# Patient Record
Sex: Female | Born: 1975
Health system: Southern US, Community
[De-identification: ages and names within clinical notes are randomized; demographics above are authoritative.]

## PROBLEM LIST (undated history)

## (undated) DIAGNOSIS — F419 Anxiety disorder, unspecified: Secondary | ICD-10-CM

## (undated) DIAGNOSIS — F1910 Other psychoactive substance abuse, uncomplicated: Secondary | ICD-10-CM

## (undated) DIAGNOSIS — F32A Depression, unspecified: Secondary | ICD-10-CM

## (undated) DIAGNOSIS — T7840XA Allergy, unspecified, initial encounter: Secondary | ICD-10-CM

## (undated) DIAGNOSIS — E785 Hyperlipidemia, unspecified: Secondary | ICD-10-CM

## (undated) HISTORY — DX: Hyperlipidemia, unspecified: E78.5

## (undated) HISTORY — DX: Allergy, unspecified, initial encounter: T78.40XA

## (undated) HISTORY — DX: Other psychoactive substance abuse, uncomplicated: F19.10

## (undated) HISTORY — DX: Depression, unspecified: F32.A

## (undated) HISTORY — DX: Anxiety disorder, unspecified: F41.9

---
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
## 1999-01-23 ENCOUNTER — Other Ambulatory Visit: Admission: RE | Admit: 1999-01-23 | Discharge: 1999-01-23 | Payer: Self-pay | Admitting: Obstetrics and Gynecology

## 1999-07-16 ENCOUNTER — Other Ambulatory Visit: Admission: RE | Admit: 1999-07-16 | Discharge: 1999-07-16 | Payer: Self-pay | Admitting: Obstetrics and Gynecology

## 2000-10-16 ENCOUNTER — Other Ambulatory Visit: Admission: RE | Admit: 2000-10-16 | Discharge: 2000-10-16 | Payer: Self-pay | Admitting: Obstetrics and Gynecology

## 2000-11-23 ENCOUNTER — Ambulatory Visit (HOSPITAL_BASED_OUTPATIENT_CLINIC_OR_DEPARTMENT_OTHER): Admission: RE | Admit: 2000-11-23 | Discharge: 2000-11-23 | Payer: Self-pay | Admitting: Surgery

## 2005-08-26 ENCOUNTER — Emergency Department (HOSPITAL_COMMUNITY): Admission: EM | Admit: 2005-08-26 | Discharge: 2005-08-26 | Payer: Self-pay | Admitting: Emergency Medicine

## 2015-09-29 ENCOUNTER — Other Ambulatory Visit: Payer: Self-pay | Admitting: Advanced Practice Midwife

## 2015-10-02 DIAGNOSIS — Z3041 Encounter for surveillance of contraceptive pills: Secondary | ICD-10-CM | POA: Diagnosis not present

## 2015-11-01 DIAGNOSIS — F411 Generalized anxiety disorder: Secondary | ICD-10-CM | POA: Diagnosis not present

## 2015-11-01 DIAGNOSIS — F331 Major depressive disorder, recurrent, moderate: Secondary | ICD-10-CM | POA: Diagnosis not present

## 2016-01-24 DIAGNOSIS — F331 Major depressive disorder, recurrent, moderate: Secondary | ICD-10-CM | POA: Diagnosis not present

## 2016-01-24 DIAGNOSIS — F411 Generalized anxiety disorder: Secondary | ICD-10-CM | POA: Diagnosis not present

## 2016-04-17 DIAGNOSIS — F331 Major depressive disorder, recurrent, moderate: Secondary | ICD-10-CM | POA: Diagnosis not present

## 2016-04-17 DIAGNOSIS — F411 Generalized anxiety disorder: Secondary | ICD-10-CM | POA: Diagnosis not present

## 2016-06-17 DIAGNOSIS — H5213 Myopia, bilateral: Secondary | ICD-10-CM | POA: Diagnosis not present

## 2016-06-17 DIAGNOSIS — H52203 Unspecified astigmatism, bilateral: Secondary | ICD-10-CM | POA: Diagnosis not present

## 2016-07-16 DIAGNOSIS — F331 Major depressive disorder, recurrent, moderate: Secondary | ICD-10-CM | POA: Diagnosis not present

## 2016-07-16 DIAGNOSIS — F411 Generalized anxiety disorder: Secondary | ICD-10-CM | POA: Diagnosis not present

## 2016-10-08 DIAGNOSIS — F331 Major depressive disorder, recurrent, moderate: Secondary | ICD-10-CM | POA: Diagnosis not present

## 2016-10-08 DIAGNOSIS — F411 Generalized anxiety disorder: Secondary | ICD-10-CM | POA: Diagnosis not present

## 2016-10-15 DIAGNOSIS — Z131 Encounter for screening for diabetes mellitus: Secondary | ICD-10-CM | POA: Diagnosis not present

## 2016-10-15 DIAGNOSIS — Z1329 Encounter for screening for other suspected endocrine disorder: Secondary | ICD-10-CM | POA: Diagnosis not present

## 2016-10-15 DIAGNOSIS — Z1151 Encounter for screening for human papillomavirus (HPV): Secondary | ICD-10-CM | POA: Diagnosis not present

## 2016-10-15 DIAGNOSIS — Z6834 Body mass index (BMI) 34.0-34.9, adult: Secondary | ICD-10-CM | POA: Diagnosis not present

## 2016-10-15 DIAGNOSIS — Z13220 Encounter for screening for lipoid disorders: Secondary | ICD-10-CM | POA: Diagnosis not present

## 2016-10-15 DIAGNOSIS — Z0000 Encounter for general adult medical examination without abnormal findings: Secondary | ICD-10-CM | POA: Diagnosis not present

## 2016-10-15 DIAGNOSIS — Z1231 Encounter for screening mammogram for malignant neoplasm of breast: Secondary | ICD-10-CM | POA: Diagnosis not present

## 2016-10-15 DIAGNOSIS — Z01419 Encounter for gynecological examination (general) (routine) without abnormal findings: Secondary | ICD-10-CM | POA: Diagnosis not present

## 2016-10-15 DIAGNOSIS — Z130 Encounter for screening for diseases of the blood and blood-forming organs and certain disorders involving the immune mechanism: Secondary | ICD-10-CM | POA: Diagnosis not present

## 2016-10-23 DIAGNOSIS — N6322 Unspecified lump in the left breast, upper inner quadrant: Secondary | ICD-10-CM | POA: Diagnosis not present

## 2016-10-23 DIAGNOSIS — N6002 Solitary cyst of left breast: Secondary | ICD-10-CM | POA: Diagnosis not present

## 2017-01-01 DIAGNOSIS — F411 Generalized anxiety disorder: Secondary | ICD-10-CM | POA: Diagnosis not present

## 2017-01-01 DIAGNOSIS — F331 Major depressive disorder, recurrent, moderate: Secondary | ICD-10-CM | POA: Diagnosis not present

## 2017-05-07 DIAGNOSIS — F411 Generalized anxiety disorder: Secondary | ICD-10-CM | POA: Diagnosis not present

## 2017-05-07 DIAGNOSIS — F331 Major depressive disorder, recurrent, moderate: Secondary | ICD-10-CM | POA: Diagnosis not present

## 2017-07-30 DIAGNOSIS — F411 Generalized anxiety disorder: Secondary | ICD-10-CM | POA: Diagnosis not present

## 2017-07-30 DIAGNOSIS — F331 Major depressive disorder, recurrent, moderate: Secondary | ICD-10-CM | POA: Diagnosis not present

## 2017-10-28 DIAGNOSIS — F411 Generalized anxiety disorder: Secondary | ICD-10-CM | POA: Diagnosis not present

## 2017-10-28 DIAGNOSIS — F331 Major depressive disorder, recurrent, moderate: Secondary | ICD-10-CM | POA: Diagnosis not present

## 2017-11-03 DIAGNOSIS — R350 Frequency of micturition: Secondary | ICD-10-CM | POA: Diagnosis not present

## 2017-11-03 DIAGNOSIS — Z01419 Encounter for gynecological examination (general) (routine) without abnormal findings: Secondary | ICD-10-CM | POA: Diagnosis not present

## 2017-11-03 DIAGNOSIS — Z1231 Encounter for screening mammogram for malignant neoplasm of breast: Secondary | ICD-10-CM | POA: Diagnosis not present

## 2017-11-03 DIAGNOSIS — N3941 Urge incontinence: Secondary | ICD-10-CM | POA: Diagnosis not present

## 2017-11-03 DIAGNOSIS — Z6836 Body mass index (BMI) 36.0-36.9, adult: Secondary | ICD-10-CM | POA: Diagnosis not present

## 2018-02-03 DIAGNOSIS — F411 Generalized anxiety disorder: Secondary | ICD-10-CM | POA: Diagnosis not present

## 2018-02-03 DIAGNOSIS — F331 Major depressive disorder, recurrent, moderate: Secondary | ICD-10-CM | POA: Diagnosis not present

## 2018-06-08 DIAGNOSIS — F331 Major depressive disorder, recurrent, moderate: Secondary | ICD-10-CM | POA: Diagnosis not present

## 2018-06-08 DIAGNOSIS — F411 Generalized anxiety disorder: Secondary | ICD-10-CM | POA: Diagnosis not present

## 2018-10-05 DIAGNOSIS — F411 Generalized anxiety disorder: Secondary | ICD-10-CM | POA: Diagnosis not present

## 2018-10-05 DIAGNOSIS — F331 Major depressive disorder, recurrent, moderate: Secondary | ICD-10-CM | POA: Diagnosis not present

## 2019-03-10 DIAGNOSIS — Z01419 Encounter for gynecological examination (general) (routine) without abnormal findings: Secondary | ICD-10-CM | POA: Diagnosis not present

## 2019-03-10 DIAGNOSIS — Z1151 Encounter for screening for human papillomavirus (HPV): Secondary | ICD-10-CM | POA: Diagnosis not present

## 2019-03-10 DIAGNOSIS — Z1231 Encounter for screening mammogram for malignant neoplasm of breast: Secondary | ICD-10-CM | POA: Diagnosis not present

## 2019-03-10 DIAGNOSIS — Z6835 Body mass index (BMI) 35.0-35.9, adult: Secondary | ICD-10-CM | POA: Diagnosis not present

## 2020-07-23 ENCOUNTER — Other Ambulatory Visit: Payer: Self-pay | Admitting: Family Medicine

## 2020-07-23 ENCOUNTER — Ambulatory Visit: Payer: BC Managed Care – PPO | Admitting: Family Medicine

## 2020-07-23 ENCOUNTER — Other Ambulatory Visit: Payer: Self-pay

## 2020-07-23 ENCOUNTER — Encounter: Payer: Self-pay | Admitting: Family Medicine

## 2020-07-23 ENCOUNTER — Ambulatory Visit (INDEPENDENT_AMBULATORY_CARE_PROVIDER_SITE_OTHER): Payer: BC Managed Care – PPO

## 2020-07-23 VITALS — BP 97/63 | HR 75 | Temp 98.3°F | Ht 68.0 in | Wt 239.2 lb

## 2020-07-23 DIAGNOSIS — M545 Low back pain, unspecified: Secondary | ICD-10-CM | POA: Diagnosis not present

## 2020-07-23 DIAGNOSIS — R202 Paresthesia of skin: Secondary | ICD-10-CM

## 2020-07-23 DIAGNOSIS — Z7689 Persons encountering health services in other specified circumstances: Secondary | ICD-10-CM

## 2020-07-23 LAB — CMP14+EGFR
Albumin: 4.4 g/dL (ref 3.8–4.8)
CO2: 23 mmol/L (ref 20–29)
Creatinine, Ser: 0.77 mg/dL (ref 0.57–1.00)
GFR calc non Af Amer: 95 mL/min/{1.73_m2} (ref >59)
Potassium: 4.6 mmol/L (ref 3.5–5.2)
Sodium: 139 mmol/L (ref 134–144)
Total Protein: 7 g/dL (ref 6.0–8.5)

## 2020-07-23 LAB — CBC WITH DIFFERENTIAL/PLATELET
Basophils Absolute: 0.1 10*3/uL (ref 0.0–0.2)
MCH: 29.2 pg (ref 26.6–33.0)
Monocytes Absolute: 0.7 10*3/uL (ref 0.1–0.9)
WBC: 9.6 10*3/uL (ref 3.4–10.8)

## 2020-07-23 MED ORDER — PREDNISONE 10 MG (21) PO TBPK: ORAL_TABLET | ORAL | 0 refills | Status: DC

## 2020-07-23 NOTE — Patient Instructions (Signed)
Orthopedic physical assessment (7th ed., pp. 164- 242). Elsevier.">  Paresthesia Paresthesia is an abnormal burning or prickling sensation. It is usually felt in the hands, arms, legs, or feet. However, it may occur in any part of the body. Usually, paresthesia is not painful. It may feel like:  Tingling or numbness.  Buzzing.  Itching. Paresthesia may occur without any clear cause, or it may be caused by:  Breathing too quickly (hyperventilation).  Pressure on a nerve.  An underlying medical condition.  Side effects of a medication.  Nutritional deficiencies.  Exposure to toxic chemicals. Most people experience temporary (transient) paresthesia at some time in their lives. For some people, it may be long-lasting (chronic) because of an underlying medical condition. If you have paresthesia that lasts a long time, you need to be evaluated by your health care provider. Follow these instructions at home: Alcohol use  Do not drink alcohol if: ? Your health care provider tells you not to drink. ? You are pregnant, may be pregnant, or are planning to become pregnant.  If you drink alcohol: ? Limit how much you use to:  0-1 drink a day for women.  0-2 drinks a day for men. ? Be aware of how much alcohol is in your drink. In the [place] one drink equals one 12 oz bottle of beer (355 mL), one 5 oz glass of wine (148 mL), or one 1 oz glass of hard liquor (44 mL).   Nutrition  Eat a healthy diet. This includes: ? Eating foods that are high in fiber, such as fresh fruits and vegetables, whole grains, and beans. ? Limiting foods that are high in fat and processed sugars, such as fried or sweet foods.   General instructions  Take over-the-counter and prescription medicines only as told by your health care provider.  Do not use any products that contain nicotine or tobacco, such as cigarettes and e-cigarettes. These can keep blood from reaching damaged nerves. If you need help quitting,  ask your health care provider.  If you have diabetes, work closely with your health care provider to keep your blood sugar under control.  If you have numbness in your feet: ? Check every day for signs of injury or infection. Watch for redness, warmth, and swelling. ? Wear padded socks and comfortable shoes. These help protect your feet.  Keep all follow-up visits as told by your health care provider. This is important. Contact a health care provider if you:  Have paresthesia that gets worse or does not go away.  Have numbness after an injury.  Have a burning or prickling feeling that gets worse when you walk.  Have pain, cramps, or dizziness, or you faint.  Develop a rash. Get help right away if you:  Feel muscle weakness.  Develop new weakness in an arm or leg.  Have trouble walking or moving.  Have problems with speech, understanding, or vision.  Feel confused.  Cannot control your bladder or bowel movements. Summary  Paresthesia is an abnormal burning or prickling sensation that is usually felt in the hands, arms, legs, or feet. It may also occur in other parts of the body.  Paresthesia may occur without any clear cause, or it may be caused by breathing too quickly (hyperventilation), pressure on a nerve, an underlying medical condition, side effects of a medication, nutritional deficiencies, or exposure to toxic chemicals.  If you have paresthesia that lasts a long time, you need to be evaluated by your health care provider.   This information is not intended to replace advice given to you by your health care provider. Make sure you discuss any questions you have with your health care provider. Document Revised: 02/28/2020 Document Reviewed: 02/28/2020 Elsevier Patient Education  2021 Elsevier Inc.  

## 2020-07-23 NOTE — Progress Notes (Signed)
Established Patient Office Visit  Subjective:  Patient ID: Candice Alvarez, female    DOB: 21-Oct-1975  Age: 45 y.o. MRN: [id number]  CC:  Chief Complaint  Patient presents with  . New Patient (Initial Visit)    HPI Candice Alvarez presents to establish care. She denies medical problems. She sees Cyril for paps/mammograms and physicals. She is UTD and had her last physical with lab work on 05/17/20 . She has one concern today.  1. Right leg numbness Briann reports right leg numbness for 1 week. She also reports a burning sensation in this leg. She denies injury or trauma. She denies changes in gait, weakness, swelling, rash, saddle anesthesia, or changes in bowel or bladder control. She has taken advil a few times without improvement. She does reports some mild lower back pain that occurs when she bends or moves in certain ways. She also reports some discomfort in the right hip.   Past Medical History:  Diagnosis Date  . Allergy   . Anxiety   . Depression   . Hyperlipidemia   . Substance abuse ([place])    hx of heroin addiction-last used in 2007    Past Surgical History:  Procedure Laterality Date  . WISDOM TOOTH EXTRACTION  1994    Family History  Problem Relation Age of Onset  . Arthritis Mother   . Hyperlipidemia Mother   . Hypertension Mother   . Stroke Mother   . Cancer Father        prostate  . Hyperlipidemia Father   . Hyperlipidemia Maternal Grandmother   . Alcohol abuse Brother   . Anxiety disorder Brother   . Depression Brother   . Drug abuse Brother     Social History   Socioeconomic History  . Marital status: Married    Spouse name: Candice Alvarez Resides  . Number of children: 0  . Years of education: 77  . Highest education level: Some college, no degree  Occupational History  . Occupation: Environmental education officer at [employer]  . Smoking status: Former Smoker    Packs/day: 0.25    Years: 14.00    Pack years: 3.50    Types:  Cigarettes    Quit date: 2007    Years since quitting: 15.1  . Smokeless tobacco: Never Used  Vaping Use  . Vaping Use: Never used  Substance and Sexual Activity  . Alcohol use: Yes    Alcohol/week: 1.0 standard drink    Types: 1 Shots of liquor per week  . Drug use: Yes    Types: Marijuana  . Sexual activity: Yes    Birth control/protection: Surgical    Comment: husband has a vasectomy  Other Topics Concern  . Not on file  Social History Narrative  . Not on file   Social Determinants of Health   Financial Resource Strain: Not on file  Food Insecurity: Not on file  Transportation Needs: Not on file  Physical Activity: Not on file  Stress: Not on file  Social Connections: Not on file  Intimate Partner Violence: Not on file    Outpatient Medications Prior to Visit  Medication Sig Dispense Refill  . acyclovir (ZOVIRAX) 400 MG tablet Take 400 mg by mouth every 8 (eight) hours.    Marland Kitchen FLUoxetine (PROZAC) 40 MG capsule Take 40 mg by mouth daily.     No facility-administered medications prior to visit.    Allergies  Allergen Reactions  . Lamictal [Lamotrigine] Other (See Comments)  [name] Syndrome  . Sulfa Antibiotics Itching    ROS Review of Systems Negative unless specially indicated above in HPI.   Objective:    Physical Exam Vitals and nursing note reviewed.  Constitutional:      General: She is not in acute distress.    Appearance: Normal appearance. She is not ill-appearing, toxic-appearing or diaphoretic.  HENT:     Head: Normocephalic and atraumatic.  Eyes:     Extraocular Movements: Extraocular movements intact.     Pupils: Pupils are equal, round, and reactive to light.  Cardiovascular:     Rate and Rhythm: Normal rate and regular rhythm.     Heart sounds: Normal heart sounds. No murmur heard.   Pulmonary:     Effort: Pulmonary effort is normal. No respiratory distress.     Breath sounds: Normal breath sounds.  Musculoskeletal:         General: No swelling, tenderness or deformity. Normal range of motion.     Cervical back: Normal.     Thoracic back: Normal.     Lumbar back: Normal. Negative right straight leg raise test and negative left straight leg raise test.     Right hip: Normal.     Left hip: Normal.     Right upper leg: Normal.     Left upper leg: Normal.     Right lower leg: No swelling, tenderness or bony tenderness. No edema.     Left lower leg: No swelling, tenderness or bony tenderness. No edema.  Skin:    General: Skin is warm and dry.  Neurological:     Mental Status: She is alert and oriented to person, place, and time.     Cranial Nerves: No cranial nerve deficit or facial asymmetry.     Motor: No weakness, tremor, atrophy or abnormal muscle tone.     Coordination: Coordination is intact.     Gait: Gait normal.     Comments: Patient reports slight decreased sensation on exam to right leg.   Psychiatric:        Mood and Affect: Mood normal.        Behavior: Behavior normal.     BP 97/63   Pulse 75   Temp 98.3 F (36.8 C) (Temporal)   Ht 5' 8"  (1.727 m)   Wt 239 lb 4 oz (108.5 kg)   BMI 36.38 kg/m  Wt Readings from Last 3 Encounters:  07/23/20 239 lb 4 oz (108.5 kg)     Health Maintenance Due  Topic Date Due  . Hepatitis C Screening  Never done  . HIV Screening  Never done  . PAP SMEAR-Modifier  Never done    There are no preventive care reminders to display for this patient.  No results found for: TSH No results found for: WBC, HGB, HCT, MCV, PLT No results found for: NA, K, CHLORIDE, CO2, GLUCOSE, BUN, CREATININE, BILITOT, ALKPHOS, AST, ALT, PROT, ALBUMIN, CALCIUM, ANIONGAP, EGFR, GFR No results found for: CHOL No results found for: HDL No results found for: LDLCALC No results found for: TRIG No results found for: CHOLHDL No results found for: HGBA1C    Assessment & Plan:   Alaysiah was seen today for new patient (initial visit).  Diagnoses and all orders for this  visit:  Paresthesia of right leg ? Sciatica. Lumbar xray today in office, radiology report pending. Labs pending as below. Prednisone dose pack given. Take advil OTC. Will notify patient of result.  Discussed referral to neurology if  no improvement or worsening in symptoms. Return to office for new or worsening symptoms, or if symptoms persist.  -     predniSONE (STERAPRED UNI-PAK 21 TAB) 10 MG (21) TBPK tablet; Use as directed on back of pill pack -     DG Lumbar Spine; Future -     CMP14+EGFR -     CBC with Differential/Platelet -     Vitamin B12  Acute bilateral low back pain, unspecified whether sciatica present Steroid dose pack given. Advil, rest, heat, ice as needed. Return to office for new or worsening symptoms, or if symptoms persist.  -     predniSONE (STERAPRED UNI-PAK 21 TAB) 10 MG (21) TBPK tablet; Use as directed on back of pill pack -     DG Lumbar Spine; Future  Encounter to establish care -     CMP14+EGFR -     CBC with Differential/Platelet    Follow-up: Return in about 6 months (around 01/20/2021) for CPE, or sooner if symptoms worsen or fail to improve.  The patient indicates understanding of these issues and agrees with the plan.  Gwenlyn Perking, FNP

## 2020-07-24 LAB — CMP14+EGFR
ALT: 11 IU/L (ref 0–32)
AST: 11 IU/L (ref 0–40)
Albumin/Globulin Ratio: 1.7 (ref 1.2–2.2)
Alkaline Phosphatase: 64 IU/L (ref 44–121)
BUN/Creatinine Ratio: 13 (ref 9–23)
BUN: 10 mg/dL (ref 6–24)
Calcium: 9.4 mg/dL (ref 8.7–10.2)
Chloride: 102 mmol/L (ref 96–106)
GFR calc Af Amer: 109 mL/min/{1.73_m2} (ref >59)
Globulin, Total: 2.6 g/dL (ref 1.5–4.5)
Glucose: 88 mg/dL (ref 65–99)

## 2020-07-24 LAB — CBC WITH DIFFERENTIAL/PLATELET
Basos: 1 %
EOS (ABSOLUTE): 0.1 10*3/uL (ref 0.0–0.4)
Eos: 2 %
Hematocrit: 43.5 % (ref 34.0–46.6)
Hemoglobin: 14.3 g/dL (ref 11.1–15.9)
Immature Grans (Abs): 0.1 10*3/uL (ref 0.0–0.1)
Immature Granulocytes: 1 %
Lymphocytes Absolute: 3 10*3/uL (ref 0.7–3.1)
Lymphs: 31 %
MCHC: 32.9 g/dL (ref 31.5–35.7)
MCV: 89 fL (ref 79–97)
Monocytes: 7 %
Neutrophils Absolute: 5.7 10*3/uL (ref 1.4–7.0)
Neutrophils: 58 %
Platelets: 265 10*3/uL (ref 150–450)
RBC: 4.9 x10E6/uL (ref 3.77–5.28)
RDW: 12.5 % (ref 11.7–15.4)

## 2020-07-24 LAB — VITAMIN B12: Vitamin B-12: 353 pg/mL (ref 232–1245)

## 2020-07-26 ENCOUNTER — Ambulatory Visit: Payer: Self-pay | Admitting: Family Medicine

## 2020-07-30 ENCOUNTER — Telehealth: Payer: Self-pay

## 2020-07-30 NOTE — Telephone Encounter (Signed)
Patient was seen 07/23/20 - given prednisone

## 2020-07-31 NOTE — Telephone Encounter (Signed)
Did that improve her symptoms? She can continue antiinflammatories. Could do PT? Or referral to ortho?

## 2020-07-31 NOTE — Telephone Encounter (Signed)
Left message to call back  

## 2021-01-21 ENCOUNTER — Encounter: Payer: Self-pay | Admitting: Family Medicine

## 2021-01-21 ENCOUNTER — Ambulatory Visit: Payer: BC Managed Care – PPO | Admitting: Family Medicine

## 2021-01-21 ENCOUNTER — Other Ambulatory Visit: Payer: Self-pay

## 2021-01-21 VITALS — BP 100/60 | HR 81 | Temp 98.3°F | Ht 68.0 in | Wt 231.5 lb

## 2021-01-21 DIAGNOSIS — E785 Hyperlipidemia, unspecified: Secondary | ICD-10-CM

## 2021-01-21 DIAGNOSIS — F1911 Other psychoactive substance abuse, in remission: Secondary | ICD-10-CM

## 2021-01-21 DIAGNOSIS — Z0001 Encounter for general adult medical examination with abnormal findings: Secondary | ICD-10-CM | POA: Diagnosis not present

## 2021-01-21 DIAGNOSIS — E669 Obesity, unspecified: Secondary | ICD-10-CM | POA: Diagnosis not present

## 2021-01-21 DIAGNOSIS — Z0000 Encounter for general adult medical examination without abnormal findings: Secondary | ICD-10-CM

## 2021-01-21 NOTE — Patient Instructions (Signed)
Health Maintenance, Female Adopting a healthy lifestyle and getting preventive care are important in promoting health and wellness. Ask your health care provider about: The right schedule for you to have regular tests and exams. Things you can do on your own to prevent diseases and keep yourself healthy. What should I know about diet, weight, and exercise? Eat a healthy diet  Eat a diet that includes plenty of vegetables, fruits, low-fat dairy products, and lean protein. Do not eat a lot of foods that are high in solid fats, added sugars, or sodium.  Maintain a healthy weight Body mass index (BMI) is used to identify weight problems. It estimates body fat based on height and weight. Your health care provider can help determineyour BMI and help you achieve or maintain a healthy weight. Get regular exercise Get regular exercise. This is one of the most important things you can do for your health. Most adults should: Exercise for at least 150 minutes each week. The exercise should increase your heart rate and make you sweat (moderate-intensity exercise). Do strengthening exercises at least twice a week. This is in addition to the moderate-intensity exercise. Spend less time sitting. Even light physical activity can be beneficial. Watch cholesterol and blood lipids Have your blood tested for lipids and cholesterol at 44 years of age, then havethis test every 5 years. Have your cholesterol levels checked more often if: Your lipid or cholesterol levels are high. You are older than 44 years of age. You are at high risk for heart disease. What should I know about cancer screening? Depending on your health history and family history, you may need to have cancer screening at various ages. This may include screening for: Breast cancer. Cervical cancer. Colorectal cancer. Skin cancer. Lung cancer. What should I know about heart disease, diabetes, and high blood pressure? Blood pressure and heart  disease High blood pressure causes heart disease and increases the risk of stroke. This is more likely to develop in people who have high blood pressure readings, are of African descent, or are overweight. Have your blood pressure checked: Every 3-5 years if you are 18-39 years of age. Every year if you are 40 years old or older. Diabetes Have regular diabetes screenings. This checks your fasting blood sugar level. Have the screening done: Once every three years after age 40 if you are at a normal weight and have a low risk for diabetes. More often and at a younger age if you are overweight or have a high risk for diabetes. What should I know about preventing infection? Hepatitis B If you have a higher risk for hepatitis B, you should be screened for this virus. Talk with your health care provider to find out if you are at risk forhepatitis B infection. Hepatitis C Testing is recommended for: Everyone born from 1945 through 1965. Anyone with known risk factors for hepatitis C. Sexually transmitted infections (STIs) Get screened for STIs, including gonorrhea and chlamydia, if: You are sexually active and are younger than 44 years of age. You are older than 44 years of age and your health care provider tells you that you are at risk for this type of infection. Your sexual activity has changed since you were last screened, and you are at increased risk for chlamydia or gonorrhea. Ask your health care provider if you are at risk. Ask your health care provider about whether you are at high risk for HIV. Your health care provider may recommend a prescription medicine to help   prevent HIV infection. If you choose to take medicine to prevent HIV, you should first get tested for HIV. You should then be tested every 3 months for as long as you are taking the medicine. Pregnancy If you are about to stop having your period (premenopausal) and you may become pregnant, seek counseling before you get  pregnant. Take 400 to 800 micrograms (mcg) of folic acid every day if you become pregnant. Ask for birth control (contraception) if you want to prevent pregnancy. Osteoporosis and menopause Osteoporosis is a disease in which the bones lose minerals and strength with aging. This can result in bone fractures. If you are 65 years old or older, or if you are at risk for osteoporosis and fractures, ask your health care provider if you should: Be screened for bone loss. Take a calcium or vitamin D supplement to lower your risk of fractures. Be given hormone replacement therapy (HRT) to treat symptoms of menopause. Follow these instructions at home: Lifestyle Do not use any products that contain nicotine or tobacco, such as cigarettes, e-cigarettes, and chewing tobacco. If you need help quitting, ask your health care provider. Do not use street drugs. Do not share needles. Ask your health care provider for help if you need support or information about quitting drugs. Alcohol use Do not drink alcohol if: Your health care provider tells you not to drink. You are pregnant, may be pregnant, or are planning to become pregnant. If you drink alcohol: Limit how much you use to 0-1 drink a day. Limit intake if you are breastfeeding. Be aware of how much alcohol is in your drink. In the [place] one drink equals one 12 oz bottle of beer (355 mL), one 5 oz glass of wine (148 mL), or one 1 oz glass of hard liquor (44 mL). General instructions Schedule regular health, dental, and eye exams. Stay current with your vaccines. Tell your health care provider if: You often feel depressed. You have ever been abused or do not feel safe at home. Summary Adopting a healthy lifestyle and getting preventive care are important in promoting health and wellness. Follow your health care provider's instructions about healthy diet, exercising, and getting tested or screened for diseases. Follow your health care provider's  instructions on monitoring your cholesterol and blood pressure. This information is not intended to replace advice given to you by your health care provider. Make sure you discuss any questions you have with your healthcare provider. Document Revised: 05/12/2018 Document Reviewed: 05/12/2018 Elsevier Patient Education  2022 Elsevier Inc.  

## 2021-01-21 NOTE — Progress Notes (Signed)
Candice Alvarez is a 45 y.o. female presents to office today for annual physical exam examination.    Concerns today include: 1. None. She has a physical with her Ob/gyn in December of 2021. She had a pap, mammo, and labs completed at that time. She was told that her cholesterol was high. She knows she needs to work on her diet and exercise to lower her cholesterol. She will fill out a records request that have these records sent over.   Occupation: manages an Psychologist, counselling, Marital status: married, Substance use: marijuan, hx of heroin abuse- last use in 2007 Diet: regular, Exercise: walks some Last eye exam: in last year Last dental exam: within 6 months Last mammogram: this year Last pap smear: this year  Depression screen [facility] 2/9 01/21/2021  Decreased Interest 0  Down, Depressed, Hopeless 0  PHQ - 2 Score 0     Past Medical History:  Diagnosis Date   Allergy    Anxiety    Depression    Hyperlipidemia    Substance abuse ([facility]CC)    hx of heroin addiction-last used in 2007   Social History   Socioeconomic History   Marital status: Married    Spouse name: Candice Alvarez   Number of children: 0   Years of education: 14   Highest education level: Some college, no degree  Occupational History   Occupation: Engineer, manufacturing at [employer]  Tobacco Use   Smoking status: Former    Packs/day: 0.25    Years: 14.00    Pack years: 3.50    Types: Cigarettes    Quit date: 2007    Years since quitting: 15.6   Smokeless tobacco: Never  Vaping Use   Vaping Use: Never used  Substance and Sexual Activity   Alcohol use: Yes    Alcohol/week: 1.0 standard drink    Types: 1 Shots of liquor per week   Drug use: Yes    Types: Marijuana   Sexual activity: Yes    Birth control/protection: Surgical    Comment: husband has a vasectomy  Other Topics Concern   Not on file  Social History Narrative   Not on file   Social Determinants of Health   Financial Resource Strain: Not  on file  Food Insecurity: Not on file  Transportation Needs: Not on file  Physical Activity: Not on file  Stress: Not on file  Social Connections: Not on file  Intimate Partner Violence: Not on file   Past Surgical History:  Procedure Laterality Date   WISDOM TOOTH EXTRACTION  1994   Family History  Problem Relation Age of Onset   Arthritis Mother    Hyperlipidemia Mother    Hypertension Mother    Stroke Mother    Cancer Father        prostate   Hyperlipidemia Father    Hyperlipidemia Maternal Grandmother    Alcohol abuse Brother    Anxiety disorder Brother    Depression Brother    Drug abuse Brother     Current Outpatient Medications:    acyclovir (ZOVIRAX) 400 MG tablet, Take 400 mg by mouth every 8 (eight) hours., Disp: , Rfl:    Calcium Carb-Cholecalciferol (CALCIUM 500 + D3 PO), Take by mouth., Disp: , Rfl:    cyanocobalamin 1000 MCG tablet, Take 1,000 mcg by mouth daily., Disp: , Rfl:    FLUoxetine (PROZAC) 40 MG capsule, Take 40 mg by mouth daily., Disp: , Rfl:   Allergies  Allergen Reactions  Lamictal [Lamotrigine] Other (See Comments)    Charlott Holler Syndrome   Sulfa Antibiotics Itching     ROS: Review of Systems Pertinent items noted in HPI and remainder of comprehensive ROS otherwise negative.    Physical exam BP 100/60   Pulse 81   Temp 98.3 F (36.8 C) (Temporal)   Ht 5\' 8"  (1.727 m)   Wt 231 lb 8 oz (105 kg)   BMI 35.20 kg/m  General appearance: alert, cooperative, and no distress Head: Normocephalic, without obvious abnormality, atraumatic Eyes: conjunctivae/corneas clear. PERRL, EOM's intact. Fundi benign. Ears: normal TM's and external ear canals both ears Nose: Nares normal. Septum midline. Mucosa normal. No drainage or sinus tenderness. Throat: lips, mucosa, and tongue normal; teeth and gums normal Neck: no adenopathy, no carotid bruit, no JVD, supple, symmetrical, trachea midline, and thyroid not enlarged, symmetric, no  tenderness/mass/nodules Lungs: clear to auscultation bilaterally Heart: regular rate and rhythm, S1, S2 normal, no murmur, click, rub or gallop Abdomen: soft, non-tender; bowel sounds normal; no masses,  no organomegaly Extremities: extremities normal, atraumatic, no cyanosis or edema Pulses: 2+ and symmetric Skin: Skin color, texture, turgor normal. No rashes or lesions Neurologic: Grossly normal    Assessment/ Plan: [name] here for annual physical exam.   No problem-specific Assessment & Plan notes found for this encounter.  Diagnoses and all orders for this visit:  Routine general medical examination at a health care facility  Obesity (BMI 30-39.9) Diet and exercise.   Hyperlipidemia, unspecified hyperlipidemia type Diet and exercise. Records release completed for labs done at ob/gyn in December.  History of substance abuse (HCC) History of heroin abuse- last use in 2007   Counseled on healthy lifestyle choices, including diet (rich in fruits, vegetables and lean meats and low in salt and simple carbohydrates) and exercise (at least 30 minutes of moderate physical activity daily).  Patient to follow up in 1 year for annual exam or sooner if needed.  The above assessment and management plan was discussed with the patient. The patient verbalized understanding of and has agreed to the management plan. Patient is aware to call the clinic if symptoms persist or worsen. Patient is aware when to return to the clinic for a follow-up visit. Patient educated on when it is appropriate to go to the emergency department.   2008, FNP-C [facility] [address] [place], [place] [phone]

## 2021-10-18 DIAGNOSIS — Z1231 Encounter for screening mammogram for malignant neoplasm of breast: Secondary | ICD-10-CM | POA: Diagnosis not present

## 2021-10-18 DIAGNOSIS — Z01411 Encounter for gynecological examination (general) (routine) with abnormal findings: Secondary | ICD-10-CM | POA: Diagnosis not present

## 2021-10-18 DIAGNOSIS — Z6835 Body mass index (BMI) 35.0-35.9, adult: Secondary | ICD-10-CM | POA: Diagnosis not present

## 2021-10-18 DIAGNOSIS — Z0142 Encounter for cervical smear to confirm findings of recent normal smear following initial abnormal smear: Secondary | ICD-10-CM | POA: Diagnosis not present

## 2021-10-18 DIAGNOSIS — Z01419 Encounter for gynecological examination (general) (routine) without abnormal findings: Secondary | ICD-10-CM | POA: Diagnosis not present

## 2021-10-18 DIAGNOSIS — Z124 Encounter for screening for malignant neoplasm of cervix: Secondary | ICD-10-CM | POA: Diagnosis not present

## 2021-10-29 DIAGNOSIS — Z0000 Encounter for general adult medical examination without abnormal findings: Secondary | ICD-10-CM | POA: Diagnosis not present

## 2021-10-29 DIAGNOSIS — Z13220 Encounter for screening for lipoid disorders: Secondary | ICD-10-CM | POA: Diagnosis not present

## 2021-10-29 DIAGNOSIS — Z1329 Encounter for screening for other suspected endocrine disorder: Secondary | ICD-10-CM | POA: Diagnosis not present

## 2021-10-29 DIAGNOSIS — Z131 Encounter for screening for diabetes mellitus: Secondary | ICD-10-CM | POA: Diagnosis not present

## 2021-11-22 DIAGNOSIS — F4323 Adjustment disorder with mixed anxiety and depressed mood: Secondary | ICD-10-CM | POA: Diagnosis not present

## 2021-12-13 DIAGNOSIS — F4323 Adjustment disorder with mixed anxiety and depressed mood: Secondary | ICD-10-CM | POA: Diagnosis not present

## 2021-12-24 DIAGNOSIS — F32A Depression, unspecified: Secondary | ICD-10-CM | POA: Diagnosis not present

## 2021-12-24 DIAGNOSIS — Z1211 Encounter for screening for malignant neoplasm of colon: Secondary | ICD-10-CM | POA: Diagnosis not present

## 2022-01-03 DIAGNOSIS — F4323 Adjustment disorder with mixed anxiety and depressed mood: Secondary | ICD-10-CM | POA: Diagnosis not present

## 2022-01-22 DIAGNOSIS — F4323 Adjustment disorder with mixed anxiety and depressed mood: Secondary | ICD-10-CM | POA: Diagnosis not present

## 2022-02-07 DIAGNOSIS — F4323 Adjustment disorder with mixed anxiety and depressed mood: Secondary | ICD-10-CM | POA: Diagnosis not present

## 2022-02-28 DIAGNOSIS — F4323 Adjustment disorder with mixed anxiety and depressed mood: Secondary | ICD-10-CM | POA: Diagnosis not present

## 2022-03-12 DIAGNOSIS — F4323 Adjustment disorder with mixed anxiety and depressed mood: Secondary | ICD-10-CM | POA: Diagnosis not present

## 2022-03-19 IMAGING — DX DG LUMBAR SPINE 2-3V
3 series · 3 of 3 positions shown · non-contrast
Comparison: None.

CLINICAL DATA: Acute bilateral back pain.  Right leg paresthesias.

EXAM:
LUMBAR SPINE - 2-3 VIEW

[l-spine ap (1 of 2)]
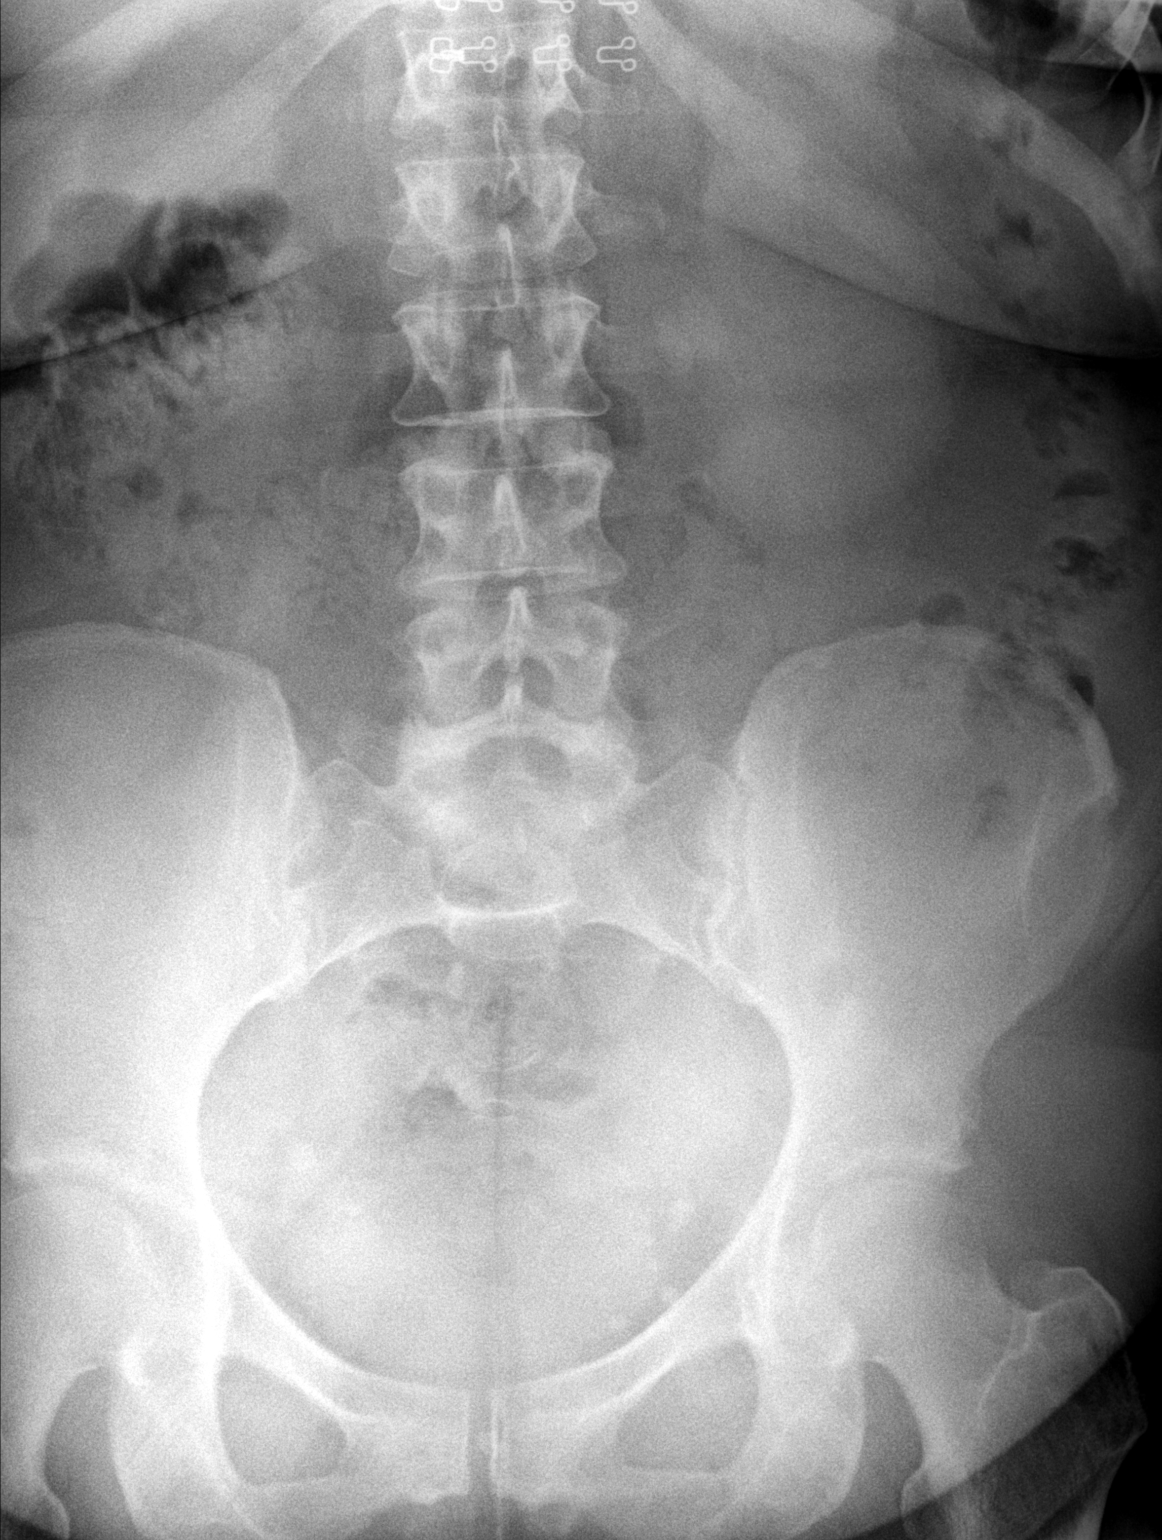

[l-spine lat]
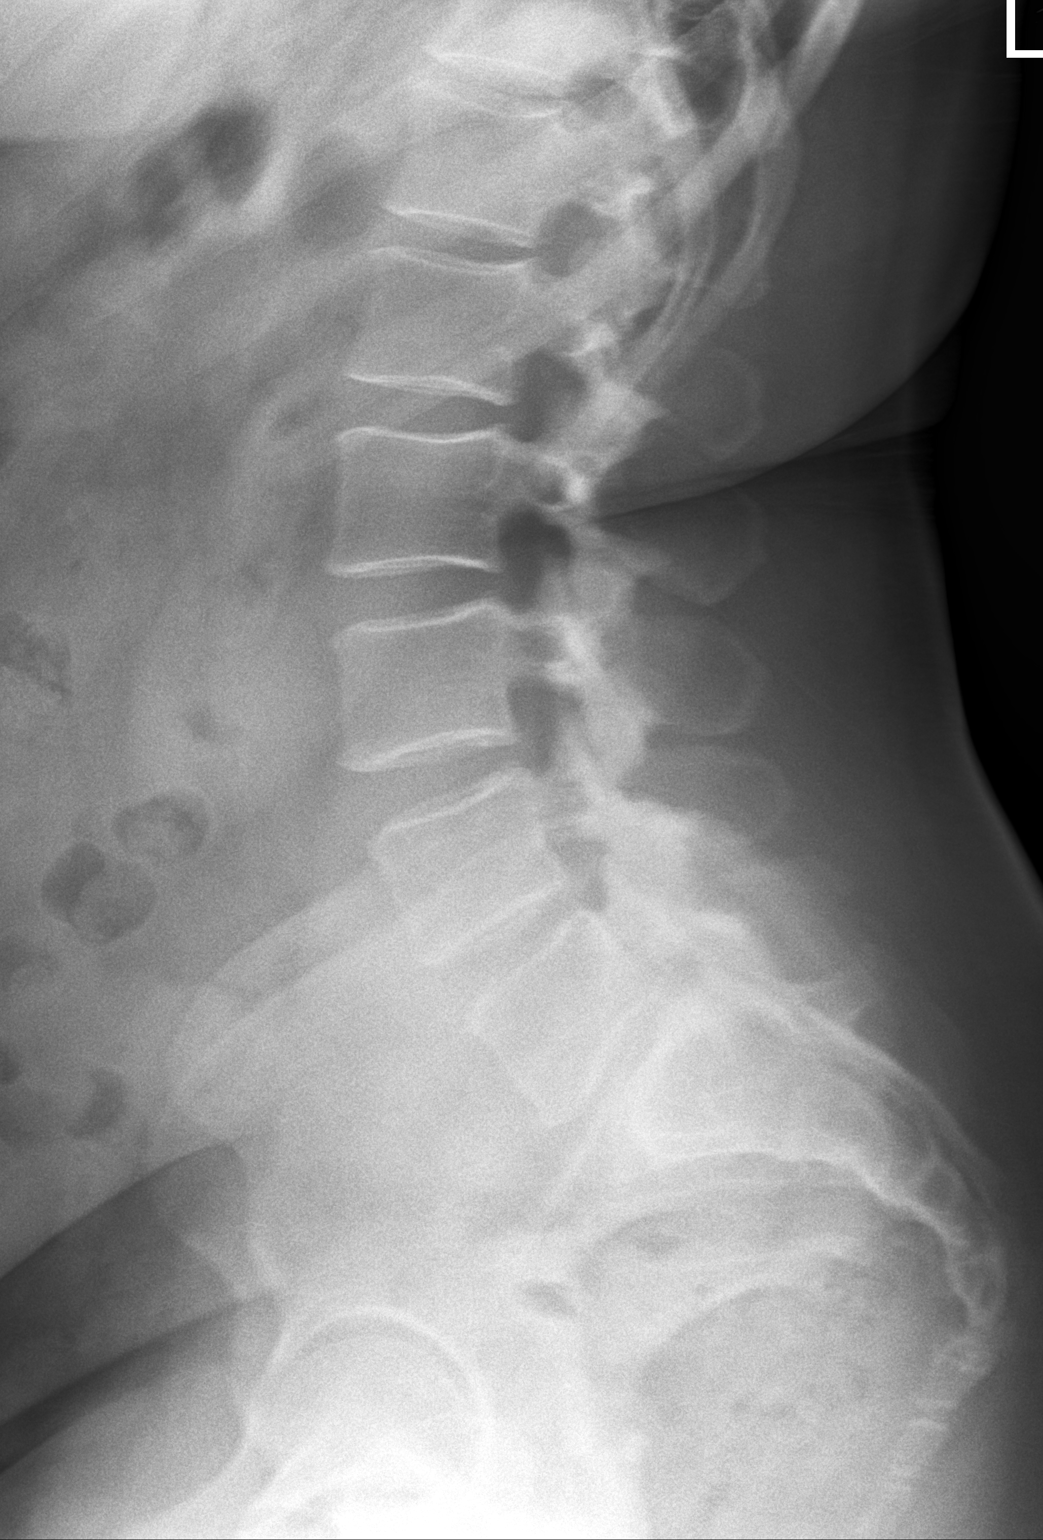

[l-spine ap (2 of 2)]
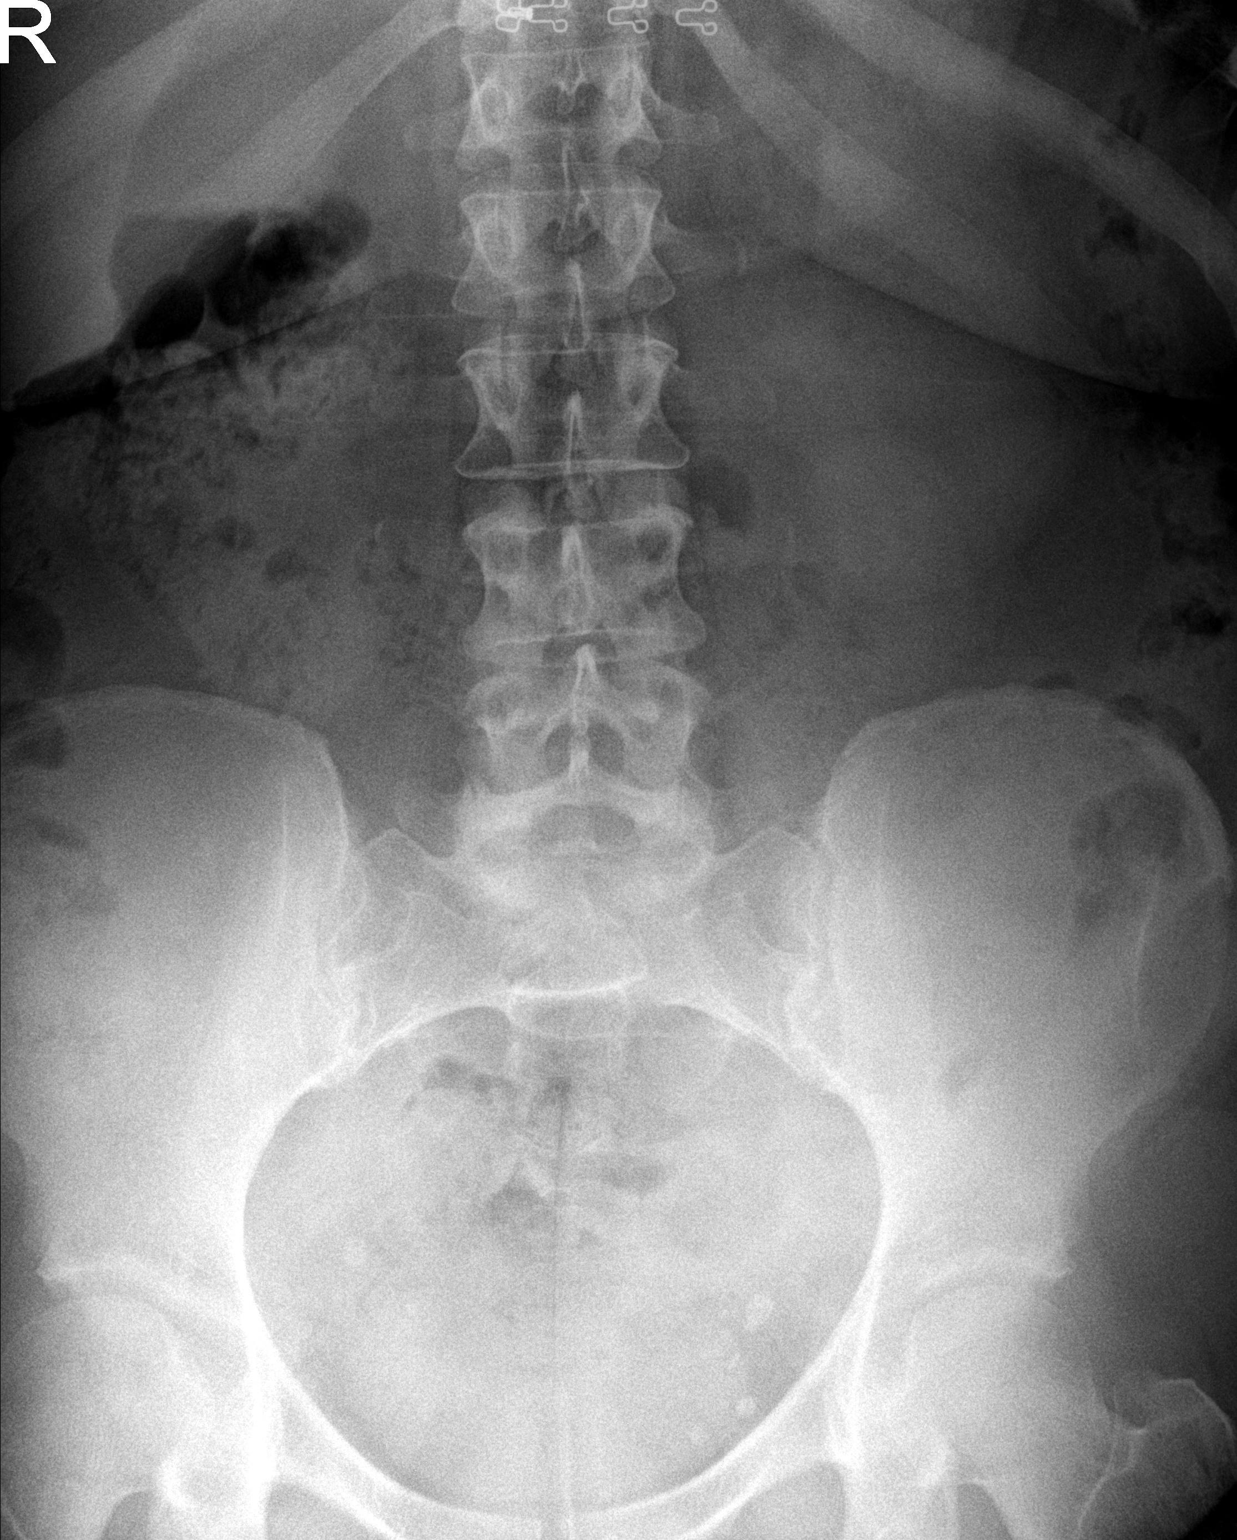

[3 of 3 positions shown; findings below may reference images not displayed]

FINDINGS: AP and lateral views of the lumbar spine obtained. There may be
trace anterolisthesis of L5 on S1. Alignment is otherwise normal.
Vertebral body heights are normal. No fracture, bony destruction or
focal lesion. There is facet hypertrophy at L4-L5 and L5-S1. Minimal
L5-S1 disc space narrowing. No obvious pars defects. Sacroiliac
joints are symmetric and normal.
IMPRESSION: 1. Facet hypertrophy at L4-L5 and L5-S1.
2. Minimal L5-S1 degenerative disc disease. Possible trace
anterolisthesis of L5 on S1.

## 2022-04-04 DIAGNOSIS — F4323 Adjustment disorder with mixed anxiety and depressed mood: Secondary | ICD-10-CM | POA: Diagnosis not present

## 2022-04-18 DIAGNOSIS — F4323 Adjustment disorder with mixed anxiety and depressed mood: Secondary | ICD-10-CM | POA: Diagnosis not present

## 2022-05-01 DIAGNOSIS — F4323 Adjustment disorder with mixed anxiety and depressed mood: Secondary | ICD-10-CM | POA: Diagnosis not present

## 2022-05-16 DIAGNOSIS — F4323 Adjustment disorder with mixed anxiety and depressed mood: Secondary | ICD-10-CM | POA: Diagnosis not present

## 2022-05-23 DIAGNOSIS — F4323 Adjustment disorder with mixed anxiety and depressed mood: Secondary | ICD-10-CM | POA: Diagnosis not present

## 2022-06-19 DIAGNOSIS — F4323 Adjustment disorder with mixed anxiety and depressed mood: Secondary | ICD-10-CM | POA: Diagnosis not present

## 2022-07-04 DIAGNOSIS — F4323 Adjustment disorder with mixed anxiety and depressed mood: Secondary | ICD-10-CM | POA: Diagnosis not present

## 2022-07-17 DIAGNOSIS — F4323 Adjustment disorder with mixed anxiety and depressed mood: Secondary | ICD-10-CM | POA: Diagnosis not present

## 2022-07-31 DIAGNOSIS — F4323 Adjustment disorder with mixed anxiety and depressed mood: Secondary | ICD-10-CM | POA: Diagnosis not present

## 2022-08-15 DIAGNOSIS — F4323 Adjustment disorder with mixed anxiety and depressed mood: Secondary | ICD-10-CM | POA: Diagnosis not present

## 2022-08-21 DIAGNOSIS — F4323 Adjustment disorder with mixed anxiety and depressed mood: Secondary | ICD-10-CM | POA: Diagnosis not present

## 2022-09-04 DIAGNOSIS — F4323 Adjustment disorder with mixed anxiety and depressed mood: Secondary | ICD-10-CM | POA: Diagnosis not present

## 2022-10-17 DIAGNOSIS — F4323 Adjustment disorder with mixed anxiety and depressed mood: Secondary | ICD-10-CM | POA: Diagnosis not present

## 2022-11-12 DIAGNOSIS — F4323 Adjustment disorder with mixed anxiety and depressed mood: Secondary | ICD-10-CM | POA: Diagnosis not present

## 2022-11-17 DIAGNOSIS — Z1231 Encounter for screening mammogram for malignant neoplasm of breast: Secondary | ICD-10-CM | POA: Diagnosis not present

## 2022-11-17 DIAGNOSIS — Z01419 Encounter for gynecological examination (general) (routine) without abnormal findings: Secondary | ICD-10-CM | POA: Diagnosis not present

## 2022-11-17 DIAGNOSIS — R87610 Atypical squamous cells of undetermined significance on cytologic smear of cervix (ASC-US): Secondary | ICD-10-CM | POA: Diagnosis not present

## 2022-11-28 ENCOUNTER — Encounter: Payer: Self-pay | Admitting: Family Medicine

## 2022-11-28 DIAGNOSIS — F4323 Adjustment disorder with mixed anxiety and depressed mood: Secondary | ICD-10-CM | POA: Diagnosis not present

## 2022-12-18 DIAGNOSIS — F4323 Adjustment disorder with mixed anxiety and depressed mood: Secondary | ICD-10-CM | POA: Diagnosis not present

## 2022-12-24 DIAGNOSIS — Z1211 Encounter for screening for malignant neoplasm of colon: Secondary | ICD-10-CM | POA: Diagnosis not present

## 2022-12-29 LAB — COLOGUARD: COLOGUARD: NEGATIVE

## 2022-12-31 DIAGNOSIS — F4323 Adjustment disorder with mixed anxiety and depressed mood: Secondary | ICD-10-CM | POA: Diagnosis not present

## 2023-01-16 DIAGNOSIS — F4323 Adjustment disorder with mixed anxiety and depressed mood: Secondary | ICD-10-CM | POA: Diagnosis not present

## 2023-01-30 DIAGNOSIS — F4323 Adjustment disorder with mixed anxiety and depressed mood: Secondary | ICD-10-CM | POA: Diagnosis not present

## 2023-02-13 DIAGNOSIS — F4323 Adjustment disorder with mixed anxiety and depressed mood: Secondary | ICD-10-CM | POA: Diagnosis not present

## 2023-02-27 DIAGNOSIS — F4323 Adjustment disorder with mixed anxiety and depressed mood: Secondary | ICD-10-CM | POA: Diagnosis not present

## 2023-03-13 DIAGNOSIS — F4323 Adjustment disorder with mixed anxiety and depressed mood: Secondary | ICD-10-CM | POA: Diagnosis not present

## 2023-03-27 DIAGNOSIS — F4323 Adjustment disorder with mixed anxiety and depressed mood: Secondary | ICD-10-CM | POA: Diagnosis not present

## 2023-04-10 DIAGNOSIS — F4323 Adjustment disorder with mixed anxiety and depressed mood: Secondary | ICD-10-CM | POA: Diagnosis not present

## 2023-04-24 DIAGNOSIS — F4323 Adjustment disorder with mixed anxiety and depressed mood: Secondary | ICD-10-CM | POA: Diagnosis not present

## 2023-05-08 DIAGNOSIS — F4323 Adjustment disorder with mixed anxiety and depressed mood: Secondary | ICD-10-CM | POA: Diagnosis not present

## 2023-05-22 DIAGNOSIS — F4323 Adjustment disorder with mixed anxiety and depressed mood: Secondary | ICD-10-CM | POA: Diagnosis not present

## 2023-06-12 DIAGNOSIS — F4323 Adjustment disorder with mixed anxiety and depressed mood: Secondary | ICD-10-CM | POA: Diagnosis not present

## 2023-07-03 DIAGNOSIS — F4323 Adjustment disorder with mixed anxiety and depressed mood: Secondary | ICD-10-CM | POA: Diagnosis not present

## 2023-07-17 DIAGNOSIS — F4323 Adjustment disorder with mixed anxiety and depressed mood: Secondary | ICD-10-CM | POA: Diagnosis not present

## 2023-07-31 DIAGNOSIS — F4323 Adjustment disorder with mixed anxiety and depressed mood: Secondary | ICD-10-CM | POA: Diagnosis not present

## 2023-08-14 DIAGNOSIS — F4323 Adjustment disorder with mixed anxiety and depressed mood: Secondary | ICD-10-CM | POA: Diagnosis not present

## 2023-08-28 DIAGNOSIS — F4323 Adjustment disorder with mixed anxiety and depressed mood: Secondary | ICD-10-CM | POA: Diagnosis not present

## 2023-09-11 DIAGNOSIS — F4323 Adjustment disorder with mixed anxiety and depressed mood: Secondary | ICD-10-CM | POA: Diagnosis not present

## 2023-09-25 DIAGNOSIS — F4323 Adjustment disorder with mixed anxiety and depressed mood: Secondary | ICD-10-CM | POA: Diagnosis not present

## 2023-10-09 DIAGNOSIS — F4323 Adjustment disorder with mixed anxiety and depressed mood: Secondary | ICD-10-CM | POA: Diagnosis not present

## 2023-11-06 DIAGNOSIS — F4323 Adjustment disorder with mixed anxiety and depressed mood: Secondary | ICD-10-CM | POA: Diagnosis not present

## 2023-11-18 DIAGNOSIS — Z1231 Encounter for screening mammogram for malignant neoplasm of breast: Secondary | ICD-10-CM | POA: Diagnosis not present

## 2023-11-18 DIAGNOSIS — Z0000 Encounter for general adult medical examination without abnormal findings: Secondary | ICD-10-CM | POA: Diagnosis not present

## 2023-11-18 DIAGNOSIS — Z13220 Encounter for screening for lipoid disorders: Secondary | ICD-10-CM | POA: Diagnosis not present

## 2023-11-18 DIAGNOSIS — Z01419 Encounter for gynecological examination (general) (routine) without abnormal findings: Secondary | ICD-10-CM | POA: Diagnosis not present

## 2023-11-18 DIAGNOSIS — Z1329 Encounter for screening for other suspected endocrine disorder: Secondary | ICD-10-CM | POA: Diagnosis not present

## 2023-11-18 DIAGNOSIS — Z1331 Encounter for screening for depression: Secondary | ICD-10-CM | POA: Diagnosis not present

## 2023-11-18 DIAGNOSIS — E559 Vitamin D deficiency, unspecified: Secondary | ICD-10-CM | POA: Diagnosis not present

## 2023-11-18 DIAGNOSIS — Z131 Encounter for screening for diabetes mellitus: Secondary | ICD-10-CM | POA: Diagnosis not present

## 2023-11-18 DIAGNOSIS — Z124 Encounter for screening for malignant neoplasm of cervix: Secondary | ICD-10-CM | POA: Diagnosis not present

## 2023-11-20 DIAGNOSIS — F4323 Adjustment disorder with mixed anxiety and depressed mood: Secondary | ICD-10-CM | POA: Diagnosis not present

## 2023-12-18 DIAGNOSIS — F4323 Adjustment disorder with mixed anxiety and depressed mood: Secondary | ICD-10-CM | POA: Diagnosis not present

## 2023-12-28 DIAGNOSIS — L710 Perioral dermatitis: Secondary | ICD-10-CM | POA: Diagnosis not present

## 2024-01-01 DIAGNOSIS — F4323 Adjustment disorder with mixed anxiety and depressed mood: Secondary | ICD-10-CM | POA: Diagnosis not present

## 2024-01-12 ENCOUNTER — Ambulatory Visit: Admitting: Family Medicine

## 2024-01-12 ENCOUNTER — Encounter: Payer: Self-pay | Admitting: Family Medicine

## 2024-01-12 VITALS — BP 110/69 | HR 83 | Temp 97.7°F | Ht 68.0 in | Wt 226.2 lb

## 2024-01-12 DIAGNOSIS — L710 Perioral dermatitis: Secondary | ICD-10-CM | POA: Diagnosis not present

## 2024-01-12 MED ORDER — METRONIDAZOLE 1 % EX GEL: Freq: Every day | CUTANEOUS | 0 refills | Status: DC

## 2024-01-12 NOTE — Patient Instructions (Signed)
 [address] hypochlorous spray

## 2024-01-12 NOTE — Progress Notes (Signed)
 Subjective:  Patient ID: Candice Alvarez, female    DOB: Aug 06, 1975, 48 y.o.   MRN: [id number]  Patient Care Team: Joesph Annabella HERO, FNP as PCP - General (Family Medicine)   Chief Complaint:  Rash (X 2 months on face)   HPI: Candice Alvarez is a 48 y.o. female presenting on 01/12/2024 for Rash (X 2 months on face)   Discussed the use of AI scribe software for clinical note transcription with the patient, who gave verbal consent to proceed.  History of Present Illness   Candice Alvarez is a 48 year old female who presents with a facial rash.  The facial rash began around the third week of June. Initially, she thought it was eczema, similar to a condition she experienced in her late teens or early twenties, although it was never formally diagnosed. She applied Exoderm cream three to four times daily, which alleviated the itching but did not resolve the rash.  She submitted pictures of the rash to [facility] and was diagnosed with psoriatic dermatitis. She was prescribed ivermectin cream, which she has been using for two weeks without improvement. The rash is spreading to her cheeks and a new spot has developed on her face.  She reports that she does not have a history of psoriasis. She works in Engineer, materials and is familiar with ivermectin as a treatment for worms and parasites, and metronidazole  for treating diarrhea in dogs.          Relevant past medical, surgical, family, and social history reviewed and updated as indicated.  Allergies and medications reviewed and updated. Data reviewed: Chart in Epic.   Past Medical History:  Diagnosis Date   Allergy    Anxiety    Depression    Hyperlipidemia    Substance abuse (HCC)    hx of heroin addiction-last used in 2007    Past Surgical History:  Procedure Laterality Date   WISDOM TOOTH EXTRACTION  1994    Social History   Socioeconomic History   Marital status: Married    Spouse name: Medford Rolls   Number of  children: 0   Years of education: 14   Highest education level: Some college, no degree  Occupational History   Occupation: Engineer, manufacturing at [employer]  Tobacco Use   Smoking status: Former    Current packs/day: 0.00    Average packs/day: 0.3 packs/day for 14.0 years (3.5 ttl pk-yrs)    Types: Cigarettes    Start date: 78    Quit date: 2007    Years since quitting: 18.6   Smokeless tobacco: Never  Vaping Use   Vaping status: Never Used  Substance and Sexual Activity   Alcohol use: Yes    Alcohol/week: 1.0 standard drink of alcohol    Types: 1 Shots of liquor per week   Drug use: Yes    Types: Marijuana   Sexual activity: Yes    Birth control/protection: Surgical    Comment: husband has a vasectomy  Other Topics Concern   Not on file  Social History Narrative   Not on file   Social Drivers of Health   Financial Resource Strain: Not on file  Food Insecurity: Not on file  Transportation Needs: Not on file  Physical Activity: Not on file  Stress: Not on file  Social Connections: Not on file  Intimate Partner Violence: Not on file    Outpatient Encounter Medications as of 01/12/2024  Medication Sig   acyclovir (  ZOVIRAX) 400 MG tablet Take 400 mg by mouth every 8 (eight) hours.   Calcium Carb-Cholecalciferol (CALCIUM 500 + D3 PO) Take by mouth.   cyanocobalamin  1000 MCG tablet Take 1,000 mcg by mouth daily.   FLUoxetine (PROZAC) 40 MG capsule Take 40 mg by mouth daily.   metroNIDAZOLE  (METROGEL ) 1 % gel Apply topically daily.   [DISCONTINUED] SOOLANTRA 1 % CREA SMARTSIG:sparingly Topical Every Night   No facility-administered encounter medications on file as of 01/12/2024.    Allergies  Allergen Reactions   Lamictal [Lamotrigine] Other (See Comments)    [name] Syndrome   Sulfa Antibiotics Itching    Pertinent ROS per HPI, otherwise unremarkable      Objective:  BP 110/69   Pulse 83   Temp 97.7 F (36.5 C)   Ht 5' 8 (1.727 m)   Wt 226  lb 3.2 oz (102.6 kg)   LMP 11/26/2023   SpO2 96%   BMI 34.39 kg/m    Wt Readings from Last 3 Encounters:  01/12/24 226 lb 3.2 oz (102.6 kg)  01/21/21 231 lb 8 oz (105 kg)  07/23/20 239 lb 4 oz (108.5 kg)    Physical Exam Vitals and nursing note reviewed.  Constitutional:      General: She is not in acute distress.    Appearance: Normal appearance. She is obese. She is not ill-appearing, toxic-appearing or diaphoretic.  HENT:     Head: Normocephalic and atraumatic.     Mouth/Throat:     Mouth: Mucous membranes are moist.  Eyes:     Pupils: Pupils are equal, round, and reactive to light.  Cardiovascular:     Rate and Rhythm: Normal rate and regular rhythm.     Heart sounds: Normal heart sounds.  Pulmonary:     Effort: Pulmonary effort is normal.     Breath sounds: Normal breath sounds.  Musculoskeletal:     Cervical back: Neck supple.  Skin:    General: Skin is warm and dry.     Capillary Refill: Capillary refill takes less than 2 seconds.     Findings: Rash present.     Comments: Multiple erythematous papules, coalescing to form plaques, with minimal scale around the mouth  Neurological:     General: No focal deficit present.     Mental Status: She is alert and oriented to person, place, and time.  Psychiatric:        Mood and Affect: Mood normal.        Behavior: Behavior normal.        Thought Content: Thought content normal.        Judgment: Judgment normal.      Results for orders placed or performed in visit on 07/23/20  CMP14+EGFR   Collection Time: 07/23/20 10:01 AM  Result Value Ref Range   Glucose 88 65 - 99 mg/dL   BUN 10 6 - 24 mg/dL   Creatinine, Ser 9.22 0.57 - 1.00 mg/dL   GFR calc non Af Amer 95 >59 mL/min/1.73   GFR calc Af Amer 109 >59 mL/min/1.73   BUN/Creatinine Ratio 13 9 - 23   Sodium 139 134 - 144 mmol/L   Potassium 4.6 3.5 - 5.2 mmol/L   Chloride 102 96 - 106 mmol/L   CO2 23 20 - 29 mmol/L   Calcium 9.4 8.7 - 10.2 mg/dL   Total  Protein 7.0 6.0 - 8.5 g/dL   Albumin 4.4 3.8 - 4.8 g/dL   Globulin, Total 2.6 1.5 - 4.5  g/dL   Albumin/Globulin Ratio 1.7 1.2 - 2.2   Bilirubin Total <0.2 0.0 - 1.2 mg/dL   Alkaline Phosphatase 64 44 - 121 IU/L   AST 11 0 - 40 IU/L   ALT 11 0 - 32 IU/L  CBC with Differential/Platelet   Collection Time: 07/23/20 10:01 AM  Result Value Ref Range   WBC 9.6 3.4 - 10.8 x10E3/uL   RBC 4.90 3.77 - 5.28 x10E6/uL   Hemoglobin 14.3 11.1 - 15.9 g/dL   Hematocrit 56.4 65.9 - 46.6 %   MCV 89 79 - 97 fL   MCH 29.2 26.6 - 33.0 pg   MCHC 32.9 31.5 - 35.7 g/dL   RDW 87.4 88.2 - 84.5 %   Platelets 265 150 - 450 x10E3/uL   Neutrophils 58 Not Estab. %   Lymphs 31 Not Estab. %   Monocytes 7 Not Estab. %   Eos 2 Not Estab. %   Basos 1 Not Estab. %   Neutrophils Absolute 5.7 1.4 - 7.0 x10E3/uL   Lymphocytes Absolute 3.0 0.7 - 3.1 x10E3/uL   Monocytes Absolute 0.7 0.1 - 0.9 x10E3/uL   EOS (ABSOLUTE) 0.1 0.0 - 0.4 x10E3/uL   Basophils Absolute 0.1 0.0 - 0.2 x10E3/uL   Immature Granulocytes 1 Not Estab. %   Immature Grans (Abs) 0.1 0.0 - 0.1 x10E3/uL  Vitamin B12   Collection Time: 07/23/20 10:01 AM  Result Value Ref Range   Vitamin B-12 353 232 - 1,245 pg/mL       Pertinent labs & imaging results that were available during my care of the patient were reviewed by me and considered in my medical decision making.  Assessment & Plan:  Armonie was seen today for rash.  Diagnoses and all orders for this visit:  Perioral dermatitis -     metroNIDAZOLE  (METROGEL ) 1 % gel; Apply topically daily.   Perioral dermatitis Perioral dermatitis with rash around the mouth, initially misdiagnosed as psoriatic dermatitis. Present since the third week of June, spreading to the cheeks. Previous treatment with ivermectin cream was ineffective. Classic presentation for perioral dermatitis, characterized by its location around the mouth. - Discontinue ivermectin cream. - Prescribe metronidazole  cream, apply twice  daily. If 1% cream is approved, apply once daily at night. - Use Equate eczema cream, CeraVe healing ointment, or Aquaphor for dryness. - Purchase Tower 28 hypochlorous acid spray for antimicrobial use, apply several times daily. - Minimize makeup use; use mineral-based tinted sunscreen if needed. - Monitor for improvement within one week; if no significant change, consider oral treatment.          Continue all other maintenance medications.  Follow up plan: Return if symptoms worsen or fail to improve.   Continue healthy lifestyle choices, including diet (rich in fruits, vegetables, and lean proteins, and low in salt and simple carbohydrates) and exercise (at least 30 minutes of moderate physical activity daily).    The above assessment and management plan was discussed with the patient. The patient verbalized understanding of and has agreed to the management plan. Patient is aware to call the clinic if they develop any new symptoms or if symptoms persist or worsen. Patient is aware when to return to the clinic for a follow-up visit. Patient educated on when it is appropriate to go to the emergency department.   Rosaline Bruns, FNP-C [facility] [phone]

## 2024-01-15 DIAGNOSIS — F4323 Adjustment disorder with mixed anxiety and depressed mood: Secondary | ICD-10-CM | POA: Diagnosis not present

## 2024-01-19 ENCOUNTER — Telehealth: Payer: Self-pay

## 2024-01-19 DIAGNOSIS — L710 Perioral dermatitis: Secondary | ICD-10-CM

## 2024-01-19 MED ORDER — DOXYCYCLINE HYCLATE 100 MG PO TABS: 100.0000 mg | ORAL_TABLET | Freq: Two times a day (BID) | ORAL | 0 refills | Status: AC

## 2024-01-19 MED ORDER — PREDNISONE 20 MG PO TABS: 40.0000 mg | ORAL_TABLET | Freq: Every day | ORAL | 0 refills | Status: AC

## 2024-01-19 NOTE — Telephone Encounter (Signed)
 Copied from CRM [id number]. Topic: Clinical - Medical Advice >> Jan 19, 2024 10:42 AM Leonette SQUIBB wrote: Reason for CRM: Pt called back saying the rash on her face has not gotten much better .  She is wanting to know what she needs to do.  CB#  [phone]

## 2024-01-19 NOTE — Addendum Note (Signed)
 Addended by: SEVERA ROCK HERO on: 01/19/2024 12:10 PM   Modules accepted: Orders

## 2024-01-19 NOTE — Telephone Encounter (Signed)
 Perioral dermatitis Perioral dermatitis with rash around the mouth, initially misdiagnosed as psoriatic dermatitis. Present since the third week of June, spreading to the cheeks. Previous treatment with ivermectin cream was ineffective. Classic presentation for perioral dermatitis, characterized by its location around the mouth. - Discontinue ivermectin cream. - Prescribe metronidazole  cream, apply twice daily. If 1% cream is approved, apply once daily at night. - Use Equate eczema cream, CeraVe healing ointment, or Aquaphor for dryness. - Purchase Tower 28 hypochlorous acid spray for antimicrobial use, apply several times daily. - Minimize makeup use; use mineral-based tinted sunscreen if needed. - Monitor for improvement within one week; if no significant change, consider oral treatment.    Above is from your office note- patient states her rash has not improved.  Please avise

## 2024-01-19 NOTE — Telephone Encounter (Signed)
 Patient aware and verbalizes understanding.

## 2024-01-29 DIAGNOSIS — F4323 Adjustment disorder with mixed anxiety and depressed mood: Secondary | ICD-10-CM | POA: Diagnosis not present

## 2024-02-02 ENCOUNTER — Ambulatory Visit: Payer: Self-pay

## 2024-02-02 NOTE — Telephone Encounter (Signed)
 FYI Only or Action Required?: Action required by provider: referral request, clinical question for provider, and update on patient condition.  Patient was last seen in primary care on 01/12/2024 by Severa Rock HERO, FNP.  Called Nurse Triage reporting Rash.  Symptoms began about a month ago and resolved on doxycycline  and prednisone  (last dose 1 week ago); gradually returning within 1-2 days of completing the medication  Interventions attempted: Prescription medications: ivermectin cream, Metrogel , prednisone , doxycycline .  Symptoms are: pink rash with water blisters to chin and around corners of mouth gradually worsening.  Triage Disposition: Call PCP Now (overriding See PCP When Office is Open (Within 3 Days))  Patient/caregiver understands and will follow disposition?: Yes                Message from [facility] sent at 02/02/2024 12:39 PM EDT  Summary: Rash, returned after finishing prescription.   Pt has a rash on her face that has returned after completing the prescription she was given for this. Rash is around her chin and mouth, no other symptoms.  Best contact: [phone]         Reason for Disposition  Localized rash present > 7 days  Answer Assessment - Initial Assessment Questions 1. APPEARANCE of RASH: What does the rash look like? (e.g., blisters, dry flaky skin, red spots, redness, sores)     Pink with some spots that are raising/water blister  2. LOCATION: Where is the rash located?      Chin, corners or mouth/lips.  3. NUMBER: How many spots are there?      1 large rash/patchy  4. SIZE: How big are the spots? (e.g., inches, cm; or compare to size of pinhead, tip of pen, eraser, pea)      She states it is one large rash together.  5. ONSET: When did the rash start?      Returned within a few days of finishing her doxycyline and prednisone  last week.  6. ITCHING: Does the rash itch? If Yes, ask: How bad is the itch?  (Scale  0-10; or none, mild, moderate, severe)     Yes, little bit she states if she leaves it alone it doesn't bother her too much. Mild.  7. PAIN: Does the rash hurt? If Yes, ask: How bad is the pain?  (Scale 0-10; or none, mild, moderate, severe)     No.  8. OTHER SYMPTOMS: Do you have any other symptoms? (e.g., fever)     No fever, difficulty breathing, facial or tongue swelling, rash elsewhere on body.  9. PREGNANCY: Is there any chance you are pregnant? When was your last menstrual period?     LMP: 01/14/24.  Protocols used: Rash or Redness - Localized-A-AH

## 2024-02-02 NOTE — Addendum Note (Signed)
 Addended by: SEVERA ROCK HERO on: 02/02/2024 01:53 PM   Modules accepted: Orders

## 2024-02-02 NOTE — Telephone Encounter (Signed)
 Spoke with patient and made her aware that referral has been placed and someone will reach out to her for scheduling once referral has been approved/completed. Patient voiced understanding.

## 2024-02-03 NOTE — Telephone Encounter (Signed)
 Referral sent to: [facility] [address] [place], [address] [place] [address] [phone]  MyChart Message sent to Patient with Specialty Office contact information.

## 2024-02-12 DIAGNOSIS — F4323 Adjustment disorder with mixed anxiety and depressed mood: Secondary | ICD-10-CM | POA: Diagnosis not present

## 2024-03-17 DIAGNOSIS — F4323 Adjustment disorder with mixed anxiety and depressed mood: Secondary | ICD-10-CM | POA: Diagnosis not present

## 2024-03-25 DIAGNOSIS — F4323 Adjustment disorder with mixed anxiety and depressed mood: Secondary | ICD-10-CM | POA: Diagnosis not present

## 2024-04-08 DIAGNOSIS — F4323 Adjustment disorder with mixed anxiety and depressed mood: Secondary | ICD-10-CM | POA: Diagnosis not present

## 2024-05-06 DIAGNOSIS — F4323 Adjustment disorder with mixed anxiety and depressed mood: Secondary | ICD-10-CM | POA: Diagnosis not present

## 2024-05-20 DIAGNOSIS — F4323 Adjustment disorder with mixed anxiety and depressed mood: Secondary | ICD-10-CM | POA: Diagnosis not present

## 2024-06-17 ENCOUNTER — Encounter: Payer: Self-pay | Admitting: Family Medicine

## 2024-07-08 ENCOUNTER — Ambulatory Visit: Admitting: Family Medicine

## 2024-07-08 ENCOUNTER — Encounter: Payer: Self-pay | Admitting: Family Medicine

## 2024-07-08 VITALS — BP 105/70 | HR 86 | Temp 98.2°F | Ht 68.0 in | Wt 237.8 lb

## 2024-07-08 DIAGNOSIS — F1911 Other psychoactive substance abuse, in remission: Secondary | ICD-10-CM

## 2024-07-08 DIAGNOSIS — Z0000 Encounter for general adult medical examination without abnormal findings: Secondary | ICD-10-CM

## 2024-07-08 DIAGNOSIS — F331 Major depressive disorder, recurrent, moderate: Secondary | ICD-10-CM

## 2024-07-08 DIAGNOSIS — E782 Mixed hyperlipidemia: Secondary | ICD-10-CM | POA: Insufficient documentation

## 2024-07-08 DIAGNOSIS — F419 Anxiety disorder, unspecified: Secondary | ICD-10-CM

## 2024-07-08 MED ORDER — WEGOVY 1.5 MG PO TABS: 1.5000 mg | ORAL_TABLET | Freq: Every day | ORAL | 0 refills | Status: AC

## 2024-07-08 MED ORDER — WEGOVY 4 MG PO TABS: 4.0000 mg | ORAL_TABLET | Freq: Every day | ORAL | 0 refills | Status: AC

## 2024-07-08 NOTE — Progress Notes (Signed)
 "  Complete physical exam  Patient: Candice Alvarez   DOB: July 06, 1975   49 y.o. Female  MRN: [id number]  Subjective:    Chief Complaint  Patient presents with   Annual Exam    [name] is a 49 y.o. female who presents today for a complete physical exam. She reports consuming a general diet. The patient does not participate in regular exercise at present. She generally feels well. She reports sleeping well. She does have additional problems to discuss today.   She would like to discuss the Wegovy  pill to assist with weight loss. Is not interested in injections. Unsure if insurance covers this.   Saw Ob/GYN for wellness exam and mammo in June. Had labs done. Cholesterol was elevated, otherwise normal labs.   Has been clean from heroin use since 2007.  Seeing therapist and BH for depression/anxiety. On prozac.   Most recent fall risk assessment:    01/21/2021    9:10 AM  Fall Risk   Falls in the past year? 0     Most recent depression screenings:    07/08/2024    4:34 PM 01/21/2021    9:14 AM  Depression screen PHQ 2/9  Decreased Interest 1 0  Down, Depressed, Hopeless 0 0  PHQ - 2 Score 1 0  Altered sleeping 0   Tired, decreased energy 3   Change in appetite 3   Feeling bad or failure about yourself  0   Trouble concentrating 3   Moving slowly or fidgety/restless 0   Suicidal thoughts 0   PHQ-9 Score 10   Difficult doing work/chores Somewhat difficult       07/08/2024    4:35 PM  GAD 7 : Generalized Anxiety Score  Nervous, Anxious, on Edge 0  Control/stop worrying 1  Worry too much - different things 0  Trouble relaxing 1  Restless 3  Easily annoyed or irritable 1  Afraid - awful might happen 1  Total GAD 7 Score 7  Anxiety Difficulty Somewhat difficult           Patient Care Team: Joesph Annabella HERO, FNP as PCP - General (Family Medicine)   Show/hide medication list[1]  ROS Negative unless specially indicated above in HPI.     Objective:      BP 105/70   Pulse 86   Temp 98.2 F (36.8 C) (Temporal)   Ht 5' 8 (1.727 m)   Wt 237 lb 12.8 oz (107.9 kg)   SpO2 96%   BMI 36.16 kg/m    Physical Exam Vitals and nursing note reviewed.  Constitutional:      General: She is not in acute distress.    Appearance: She is obese. She is not ill-appearing, toxic-appearing or diaphoretic.  HENT:     Head: Normocephalic.     Right Ear: Tympanic membrane, ear canal and external ear normal.     Left Ear: Tympanic membrane, ear canal and external ear normal.     Nose: Nose normal.     Mouth/Throat:     Mouth: Mucous membranes are moist.     Pharynx: Oropharynx is clear.  Eyes:     Extraocular Movements: Extraocular movements intact.     Conjunctiva/sclera: Conjunctivae normal.     Pupils: Pupils are equal, round, and reactive to light.  Neck:     Thyroid : No thyroid  mass, thyromegaly or thyroid  tenderness.  Cardiovascular:     Rate and Rhythm: Normal rate and regular rhythm.  Pulses: Normal pulses.     Heart sounds: Normal heart sounds. No murmur heard.    No friction rub. No gallop.  Pulmonary:     Effort: Pulmonary effort is normal.     Breath sounds: Normal breath sounds.  Abdominal:     General: Bowel sounds are normal. There is no distension.     Palpations: Abdomen is soft. There is no mass.     Tenderness: There is no abdominal tenderness. There is no guarding.  Musculoskeletal:        General: No tenderness.     Cervical back: Normal range of motion and neck supple. No tenderness.     Right lower leg: No edema.     Left lower leg: No edema.  Skin:    General: Skin is warm and dry.     Capillary Refill: Capillary refill takes less than 2 seconds.     Findings: No lesion or rash.  Neurological:     General: No focal deficit present.     Mental Status: She is alert and oriented to person, place, and time.     Cranial Nerves: No cranial nerve deficit.     Motor: No weakness.     Gait: Gait normal.   Psychiatric:        Mood and Affect: Mood normal.        Behavior: Behavior normal.        Thought Content: Thought content normal.        Judgment: Judgment normal.      No results found for any visits on 07/08/24.     Assessment & Plan:    Routine Health Maintenance and Physical Exam  [name] was seen today for annual exam.  Diagnoses and all orders for this visit:  Routine general medical examination at a health care facility  Morbid obesity (HCC) Mixed hyperlipidemia BMI 36 with HLD. Patient's BMI is >30 mg/m2.  Patient's current BMI is Body mass index is 36.16 kg/m. Discussed wegovy  tablet, side effects. Discussed well balanced diet and daily moderate intensity exercise for weight loss and treatment of HLD. ASCVD calculator with 1% 10 year risk. No statin recommended at this time. Patient has contraindications to phentermine, Contrave & Qsymia (contains phentermine) due to hx of substance abuse.  Patient does not have a personal or family history of medullary thyroid  carcinoma (MTC) or Multiple Endocrine Neoplasia syndrome type 2 (MEN 2). -     semaglutide -weight management (WEGOVY ) 1.5 MG tablet; Take 1 tablet (1.5 mg total) by mouth daily. Daily in AM on an empty stomach with 4 oz of water. Do not eat or drink for 30 minutes after dose. -     semaglutide -weight management (WEGOVY ) 4 MG tablet; Take 1 tablet (4 mg total) by mouth daily. Daily in morning on an empty stomach with 4 oz of water. Do not eat or drink for 30 minutes after dose.  Moderate episode of recurrent major depressive disorder (HCC) Anxiety Continue follow up with [facility], therapist.   History of substance abuse (HCC)   Immunization History  Administered Date(s) Administered   PFIZER(Purple Top)SARS-COV-2 Vaccination 01/29/2020, 02/19/2020    Health Maintenance  Topic Date Due   Influenza Vaccine  08/30/2024 (Originally 01/01/2024)   Cervical Cancer Screening (HPV/Pap Cotest)  07/08/2025 (Originally  10/12/2006)   DTaP/Tdap/Td (1 - Tdap) 07/08/2025 (Originally 10/12/1995)   Hepatitis B Vaccines 19-59 Average Risk (1 of 3 - 19+ 3-dose series) 07/08/2025 (Originally 10/12/1995)   Colonoscopy  07/08/2025 (Originally 10/11/2021)  Hepatitis C Screening  07/08/2025 (Originally 10/12/1994)   HIV Screening  07/08/2025 (Originally 10/12/1991)   COVID-19 Vaccine (3 - 2025-26 season) 07/24/2025 (Originally 02/01/2024)   Mammogram  11/16/2024   HPV VACCINES (No Doses Required) Completed   Pneumococcal Vaccine  Aged Out   Meningococcal B Vaccine  Aged Out    Discussed health benefits of physical activity, and encouraged her to engage in regular exercise appropriate for her age and condition.  Problem List Items Addressed This Visit       Other   Mixed hyperlipidemia   Morbid obesity (HCC)   Relevant Medications   semaglutide -weight management (WEGOVY ) 1.5 MG tablet   semaglutide -weight management (WEGOVY ) 4 MG tablet (Start on 08/05/2024)   Other Visit Diagnoses       Routine general medical examination at a health care facility    -  Primary     Moderate episode of recurrent major depressive disorder (HCC)         Anxiety         History of substance abuse (HCC)          Return in about 6 weeks (around 08/19/2024) for medication follow up if able to start Wegovy .   The patient indicates understanding of these issues and agrees with the plan.  Annabella CHRISTELLA Search, FNP      [1]  Outpatient Medications Prior to Visit  Medication Sig   acyclovir (ZOVIRAX) 400 MG tablet Take 400 mg by mouth every 8 (eight) hours.   Calcium Carb-Cholecalciferol (CALCIUM 500 + D3 PO) Take by mouth.   cyanocobalamin  1000 MCG tablet Take 1,000 mcg by mouth daily.   FLUoxetine (PROZAC) 40 MG capsule Take 40 mg by mouth daily.   [DISCONTINUED] metroNIDAZOLE  (METROGEL ) 1 % gel Apply topically daily.   No facility-administered medications prior to visit.   "

## 2024-07-08 NOTE — Patient Instructions (Addendum)
 [email or web address]  Health Maintenance, Female Adopting a healthy lifestyle and getting preventive care are important in promoting health and wellness. Ask your health care provider about: The right schedule for you to have regular tests and exams. Things you can do on your own to prevent diseases and keep yourself healthy. What should I know about diet, weight, and exercise? Eat a healthy diet  Eat a diet that includes plenty of vegetables, fruits, low-fat dairy products, and lean protein. Do not eat a lot of foods that are high in solid fats, added sugars, or sodium. Maintain a healthy weight Body mass index (BMI) is used to identify weight problems. It estimates body fat based on height and weight. Your health care provider can help determine your BMI and help you achieve or maintain a healthy weight. Get regular exercise Get regular exercise. This is one of the most important things you can do for your health. Most adults should: Exercise for at least 150 minutes each week. The exercise should increase your heart rate and make you sweat (moderate-intensity exercise). Do strengthening exercises at least twice a week. This is in addition to the moderate-intensity exercise. Spend less time sitting. Even light physical activity can be beneficial. Watch cholesterol and blood lipids Have your blood tested for lipids and cholesterol at 48 years of age, then have this test every 5 years. Have your cholesterol levels checked more often if: Your lipid or cholesterol levels are high. You are older than 48 years of age. You are at high risk for heart disease. What should I know about cancer screening? Depending on your health history and family history, you may need to have cancer screening at various ages. This may include screening for: Breast cancer. Cervical cancer. Colorectal cancer. Skin cancer. Lung cancer. What  should I know about heart disease, diabetes, and high blood pressure? Blood pressure and heart disease High blood pressure causes heart disease and increases the risk of stroke. This is more likely to develop in people who have high blood pressure readings or are overweight. Have your blood pressure checked: Every 3-5 years if you are 68-46 years of age. Every year if you are 43 years old or older. Diabetes Have regular diabetes screenings. This checks your fasting blood sugar level. Have the screening done: Once every three years after age 26 if you are at a normal weight and have a low risk for diabetes. More often and at a younger age if you are overweight or have a high risk for diabetes. What should I know about preventing infection? Hepatitis B If you have a higher risk for hepatitis B, you should be screened for this virus. Talk with your health care provider to find out if you are at risk for hepatitis B infection. Hepatitis C Testing is recommended for: Everyone born from [birth year] through 1964. Anyone with known risk factors for hepatitis C. Sexually transmitted infections (STIs) Get screened for STIs, including gonorrhea and chlamydia, if: You are sexually active and are younger than 48 years of age. You are older than 48 years of age and your health care provider tells you that you are at risk for this type of infection. Your sexual activity has changed since you were last screened, and you are at increased risk for chlamydia or gonorrhea. Ask your health care provider if you are at risk. Ask your health care provider about whether you are at high risk for HIV. Your health care provider may recommend a prescription medicine to help  prevent HIV infection. If you choose to take medicine to prevent HIV, you should first get tested for HIV. You should then be tested every 3 months for as long as you are taking the medicine. Pregnancy If you are about to stop having your period  (premenopausal) and you may become pregnant, seek counseling before you get pregnant. Take 400 to 800 micrograms (mcg) of folic acid every day if you become pregnant. Ask for birth control (contraception) if you want to prevent pregnancy. Osteoporosis and menopause Osteoporosis is a disease in which the bones lose minerals and strength with aging. This can result in bone fractures. If you are 15 years old or older, or if you are at risk for osteoporosis and fractures, ask your health care provider if you should: Be screened for bone loss. Take a calcium or vitamin D supplement to lower your risk of fractures. Be given hormone replacement therapy (HRT) to treat symptoms of menopause. Follow these instructions at home: Alcohol use Do not drink alcohol if: Your health care provider tells you not to drink. You are pregnant, may be pregnant, or are planning to become pregnant. If you drink alcohol: Limit how much you have to: 0-1 drink a day. Know how much alcohol is in your drink. In the [place] one drink equals one 12 oz bottle of beer (355 mL), one 5 oz glass of wine (148 mL), or one 1 oz glass of hard liquor (44 mL). Lifestyle Do not use any products that contain nicotine or tobacco. These products include cigarettes, chewing tobacco, and vaping devices, such as e-cigarettes. If you need help quitting, ask your health care provider. Do not use street drugs. Do not share needles. Ask your health care provider for help if you need support or information about quitting drugs. General instructions Schedule regular health, dental, and eye exams. Stay current with your vaccines. Tell your health care provider if: You often feel depressed. You have ever been abused or do not feel safe at home. This information is not intended to replace advice given to you by your health care provider. Make sure you discuss any questions you have with your health care provider. Document Revised: 11/25/2023  Document Reviewed: 10/08/2020 [employer] Patient Education  2025 [employer].

## 2024-09-05 ENCOUNTER — Ambulatory Visit: Payer: Self-pay | Admitting: Physician Assistant
